# Patient Record
Sex: Female | Born: 1980 | Hispanic: No | Marital: Married | Smoking: Never smoker
Health system: Southern US, Community
[De-identification: ages and names within clinical notes are randomized; demographics above are authoritative.]

## PROBLEM LIST (undated history)

## (undated) DIAGNOSIS — R112 Nausea with vomiting, unspecified: Secondary | ICD-10-CM

## (undated) DIAGNOSIS — Z9889 Other specified postprocedural states: Secondary | ICD-10-CM

## (undated) DIAGNOSIS — M79605 Pain in left leg: Secondary | ICD-10-CM

## (undated) DIAGNOSIS — T4145XA Adverse effect of unspecified anesthetic, initial encounter: Secondary | ICD-10-CM

## (undated) DIAGNOSIS — T8859XA Other complications of anesthesia, initial encounter: Secondary | ICD-10-CM

## (undated) DIAGNOSIS — M79604 Pain in right leg: Secondary | ICD-10-CM

## (undated) HISTORY — PX: TUBAL LIGATION: SHX77

---

## 2007-06-06 ENCOUNTER — Inpatient Hospital Stay (HOSPITAL_COMMUNITY): Admission: RE | Admit: 2007-06-06 | Discharge: 2007-06-09 | Payer: Self-pay | Admitting: Obstetrics and Gynecology

## 2007-06-06 ENCOUNTER — Encounter (INDEPENDENT_AMBULATORY_CARE_PROVIDER_SITE_OTHER): Payer: Self-pay | Admitting: Obstetrics and Gynecology

## 2010-09-20 NOTE — Op Note (Signed)
[name], [name]                 ACCOUNT NO.:  [id number]   MEDICAL RECORD NO.:  [id number]          PATIENT TYPE:  INP   LOCATION:  9126                          FACILITY:  [facility]   PHYSICIAN:  Maxie Better, M.D.DATE OF BIRTH:  12-08-1980   DATE OF PROCEDURE:  06/06/2007  DATE OF DISCHARGE:                               OPERATIVE REPORT   PREOPERATIVE DIAGNOSES:  1. Term gestation.  2. Previous difficult delivery, with neonatal death.   PROCEDURE:  Primary cesarean section, Kerr hysterotomy, left ovarian  cystectomy.   POSTOPERATIVE DIAGNOSES:  1. Term gestation.  2. Previous difficult delivery, with neonatal death.  3. Left ovarian cyst.   ANESTHESIA:  Spinal.   SURGEON:  Maxie Better, M.D.   ASSISTANT:  None.   INDICATIONS:  A 30 year old gravida 2, para 1-0-0-0 female at term,  admitted for a primary cesarean section secondary to previous difficult  delivery in [place], with resultant neonatal death at 60 days of age.  Surgical risk was reviewed with the patient.  Consent was signed.  The  patient was transferred to the operating room.   PROCEDURE:  Under adequate spinal anesthesia, the patient was placed in  the supine position with a left lateral tilt.  She was sterilely prepped  and draped in the usual fashion.  An indwelling Foley catheter was  sterilely placed.  One-quarter percent Marcaine was injected along the  planned Pfannenstiel skin incision site.  Pfannenstiel skin incision was  then made, carried down to the rectus fascia.  Rectus fascia was then  opened with cautery and extended transversely.  The rectus fascia was  then bluntly and sharply dissected off the rectus muscle in superior and  inferior fashion.  The rectus muscles was split in the midline.  The  parietal peritoneum was entered sharply and extended.  The vesicouterine  peritoneum was opened transversely.  The bladder was bluntly dissected  off the lower uterine segment and displaced  inferiorly using a bladder  retractor.  Large vessels were noted on both lateral aspects of the  uterus.  A curvilinear low transverse uterine incision was performed and  extended with bandage scissors, taking care to try to avoid those large  vessels.  Artificial rupture of membranes occurred.  Clear amniotic  fluid noted.  Subsequent attempted delivery from the vertex position was  unsuccessful.  Therefore, a vacuum was applied.  Suction was gently  placed, and subsequent delivery of a live female infant was accomplished.  Baby was bulb suctioned on the abdomen.  His cord was clamped, cut, and  he was transferred to the awaiting pediatrician, who assigned Apgars of  8 and 9 at one and five minutes.  The placenta, which was anterior, was  removed manually.  Uterine cavity was cleaned of debris.  Uterine  incision did not appear to have any extension.  However, there were  pumping vessels noted at both angles which were then clamped.  The  cavity was cleaned of debris.  Uterine incision was then closed in two  layers, the first layer with 0 Monocryl running- locked stitch.  However,  continued bleeding was noted on the right angle, and it was  then noted that the inferior aspect of probably the uterine vessel had  been bleeding.  Careful 0 Monocryl suturing individually was placed,  with still bleeding noted, and a layering stitch was then placed with 0  Monocryl stitch after exteriorizing the uterus.  The second layer was  then imbricated with 0 Monocryl.  Good hemostasis noted.  Normal right  tube and ovary were noted.  Normal left tube was noted.  The left ovary  had about a 3 cm cyst which appeared to be clear visually.  The decision  was then made to remove that cyst, and using a #15 mm blade, the cyst  wall was opened.  The fluid was noted subsequently clear, unruptured,  and the cyst wall was then removed.  The base was cauterized of small  bleeders, and the defect in the ovary was  then closed with 3-0 Vicryl  sutures.  The dead space was also closed with mattress stitches of 0  Vicryl sutures.  The abdomen was then copiously irrigated, suctioned,  and small bleeders cauterized.  The uterus was then returned to the  abdomen carefully. Re-irrigation of the abdomen was then performed.  Reinspection of the incision showed good hemostasis.  The parietal  peritoneum was closed with 2-0 Vicryl.  The rectus fascia was closed  with 0 Vicryl x2.  The subcutaneous area was irrigated, and small  bleeders cauterized.  Interrupted 2-0 plain sutures were then placed,  and staples were then placed along the incision.   SPECIMENS:  1. Placenta, not sent.  2. Left ovarian cyst wall sent to pathology.   ESTIMATED BLOOD LOSS:  1300 mL.   INTRAOPERATIVE FLUID:  3500 mL crystalloid.   URINE OUTPUT:  400 mL clear yellow urine.   SPONGE AND INSTRUMENT COUNTS X2:  Correct.   COMPLICATIONS:  None.   The patient tolerated this procedure well.  Weight of the baby was 7  pounds 6 ounces.  She was transferred to the recovery room in stable  condition.      Maxie Better, M.D.  Electronically Signed     /MEDQ  D:  06/06/2007  T:  06/07/2007  Job:  [id number]

## 2010-09-23 NOTE — Discharge Summary (Signed)
[name], [name]                 ACCOUNT NO.:  [id number]   MEDICAL RECORD NO.:  [id number]          PATIENT TYPE:  INP   LOCATION:  9126                          FACILITY:  [facility]   PHYSICIAN:  Maxie Better, M.D.DATE OF BIRTH:  1980-07-16   DATE OF ADMISSION:  06/06/2007  DATE OF DISCHARGE:  06/09/2007                               DISCHARGE SUMMARY   ADMISSION DIAGNOSIS:  Previously difficult delivery,  Term gestation.   DISCHARGE DIAGNOSIS:  Term gestation delivered  Status post  primary cesarean section  Left ovarian cyst.   PROCEDURES:  Primary cesarean section, Kerr hysterotomy, left ovarian  cystectomy.   HISTORY OF PRESENT ILLNESS:  This 30 year old gravida 2, para 1-0-0-1,  married black female at term, admitted for primary cesarean section  secondary to previous difficult delivery which resulted in neonatal  death at 74 days of age and the recommendation of cesarean section for  future deliveries by the previous obstetrician.  The patient had  uncomplicated prenatal course.   HOSPITAL COURSE:  The patient was admitted to [facility].  She  underwent a primary cesarean section.  Finding at the time of surgery  was a 3 cm left ovarian cyst which was not noted to be present  previously, and therefore, left ovarian cystectomy was performed.  The  procedure with respect to the cesarean section resulted in delivery of 7  pounds 6 ounces live female, Apgars of 8 and 9.  The placenta was not  sent, normal tubes were noted bilaterally and normal right ovary.  The  left ovary with the cyst as noted.  The patient had an uncomplicated  postoperative course.  Her CBC on postop day #1 showed a hemoglobin 8.2,  hematocrit 24.5, platelets 152,000, white count of 15.5.  intraoperative  blood loss had been 400 mL.  Her preop hemoglobin was 11.8.  The patient  was asymptomatic.  By postop day #3, the incision showed no evidence of  an infection.  The patient was started on iron  supplementation.  Her  incision was intact.  She was ready for discharge.   DISPOSITION:  Home.   CONDITION:  Stable.   DISCHARGE MEDICATIONS:  1. Tylox 1-2 tablets q.3-4 hours p.r.n. pain.  2. Iron supplementation one p.o. b.i.d.  3. Prenatal vitamins one p.o. daily.   DISCHARGE INSTRUCTIONS:  Per the postpartum booklet given.  Follow-up  appointment at [facility] in 6 weeks.      Maxie Better, M.D.  Electronically Signed     [place]/MEDQ  D:  06/23/2007  T:  06/25/2007  Job:  [id number]

## 2011-01-27 LAB — CBC
HCT: 35.1 — ABNORMAL LOW
Hemoglobin: 8.2 — ABNORMAL LOW
MCHC: 33.6
Platelets: 178
RBC: 2.8 — ABNORMAL LOW
RDW: 15.7 — ABNORMAL HIGH
RDW: 16.1 — ABNORMAL HIGH
WBC: 6.9

## 2011-01-27 LAB — RPR: RPR Ser Ql: NONREACTIVE

## 2017-11-13 ENCOUNTER — Ambulatory Visit (INDEPENDENT_AMBULATORY_CARE_PROVIDER_SITE_OTHER): Payer: Worker's Compensation | Admitting: Orthopaedic Surgery

## 2017-11-13 ENCOUNTER — Ambulatory Visit (INDEPENDENT_AMBULATORY_CARE_PROVIDER_SITE_OTHER): Payer: Worker's Compensation

## 2017-11-13 ENCOUNTER — Encounter (INDEPENDENT_AMBULATORY_CARE_PROVIDER_SITE_OTHER): Payer: Self-pay | Admitting: Orthopaedic Surgery

## 2017-11-13 VITALS — BP 117/76 | HR 75 | Ht 65.0 in | Wt 155.0 lb

## 2017-11-13 DIAGNOSIS — M545 Low back pain: Secondary | ICD-10-CM

## 2017-11-13 DIAGNOSIS — G8929 Other chronic pain: Secondary | ICD-10-CM

## 2017-11-13 NOTE — Progress Notes (Signed)
Office Visit Note/surgical second opinion   Patient: [name]           Date of Birth: 10/22/1980           MRN: [id number] Visit Date: 11/13/2017              Requested by: No referring provider defined for this encounter. PCP: Patient, No Pcp Per   Assessment & Plan: Visit Diagnoses:  1. 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  I discussed with her that with the retrolisthesis she is unlikely to do well with simple microdiscectomy whether it is done unilaterally or bilaterally and agree with surgical plan for single level instrumented fusion.  Scans were reviewed as well as plain x-rays.  Questions were elicited and answered with patient also rehab nurse Henrene PastorKathy Davis, RN, [place] [facility]-S.  Pathophysiology of condition discussed.  Follow-Up Instructions: No follow-ups on file.   Orders:  Orders Placed This Encounter  Procedures  . XR Lumb Spine Flex&Ext Only   No orders of the defined types were placed in this encounter.     Procedures: No procedures performed   Clinical Data: No additional findings.   Subjective: Chief Complaint  Patient presents with  . Lower Back - Pain    HPI 37 year old female here for surgical second opinion concerning a back injury that occurred on 11/24/2016.  She is here with Henrene PastorKathy Davis, RN medical case manager her fax #[phone].  [facility] and cervical [facility] had physical therapy conservative treatment.  MRI scan showed large H&P at L5-S1 with disc abutting S1 nerve root bilaterally with some retrolisthesis.  She was treated with TENS unit, therapy, work conditioning, lumbar epidurals by Dr. Maurice SmallIbazebo x2 without sustained relief.  She has seen Dr. Yevette Edwardsumonski who recommended single level fusion with instrumentation due to her large disc herniation with central and  lateral recess stenosis and retrolisthesis.  She has mild facet changes at L4-5 without compression.  Other lumbar levels above are normal.  Patient was originally injured helping lift the patient out of bed.  Patient suddenly started falling and fell on top of her.  She is had persistent problems with back aching pain that radiates into her right and also left buttocks and into her legs.  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  She is able to ambulate without walking aid.  She has increased pain with bending twisting lifting.  Inflammatories, prednisone, injections, Neurontin.  Patient lives with her husband children and sister-in-law.  Patient was on modified duty for period of time, had increased symptoms currently not working.  She last worked.  [employer] 89 and [age].  C-section x2 otherwise she is been healthy.  Patient denies bowel or bladder symptoms.  She occasionally has some discomfort in her shoulders and neck.  Review of Systems positive for previous C-section x2.  No cardiac respiratory problems.  GI GU negative.   Objective: Vital Signs: BP 117/76   Pulse 75   Ht 5\' 5"  (1.651 m)   Wt 155 lb (70.3 kg)   LMP  (LMP Unknown)   BMI 25.79 kg/m   Physical Exam  Constitutional: She is oriented to person, place, and time. She appears well-developed.  HENT:  Head: Normocephalic.  Right Ear: External ear normal.  Left Ear: External ear normal.  Eyes: Pupils  are equal, round, and reactive to light.  Neck: No tracheal deviation present. No thyromegaly present.  Cardiovascular: Normal rate.  Pulmonary/Chest: Effort normal.  Abdominal: Soft.  Neurological: She is alert and oriented to person, place, and time.  Skin: Skin is warm and dry.  Psychiatric: She has a normal mood and affect. Her behavior is normal.    Ortho Exam patient is able ambulate in the exam room she can heel and toe walk no isolated motor weakness gastrocsoleus or anterior tib peroneals posterior tib are normal.   She has sciatic notch tenderness both right and left pain with straight leg raising at 90 degrees.  Patient has slight slow gait.  Knee and ankle jerk are 2+ and symmetrical no quad or abductor weakness.  Hamstrings are normal.  Distal pulses are normal.  Specialty Comments:  No specialty comments available.  Imaging:ateral flexion-extension lumbar x-rays were obtained and reviewed.  This shows narrowing at L5-S1 disc space with a few millimeters retrolisthesis unchanged on motion films.  Impression: L5-S1 narrowing with retrolisthesis stable on flexion extension.     PMFS History: There are no active problems to display for this patient.  History reviewed. No pertinent past medical history.  No family history on file.  History reviewed. No pertinent surgical history. Social History   Occupational History  . Not on file  Tobacco Use  . Smoking status: Never Smoker  . Smokeless tobacco: Never Used  Substance and Sexual Activity  . Alcohol use: Not Currently  . Drug use: Not on file  . Sexual activity: Not on file

## 2017-11-14 ENCOUNTER — Encounter (INDEPENDENT_AMBULATORY_CARE_PROVIDER_SITE_OTHER): Payer: Self-pay | Admitting: Orthopaedic Surgery

## 2017-11-15 ENCOUNTER — Telehealth (INDEPENDENT_AMBULATORY_CARE_PROVIDER_SITE_OTHER): Payer: Self-pay

## 2017-11-15 NOTE — Telephone Encounter (Signed)
Emailed office note to case mgr Henrene PastorKathy Davis [facility]([email or web address])

## 2017-11-15 NOTE — Telephone Encounter (Signed)
-----   Message from Eldred MangesMark C Yates, MD sent at 11/14/2017 12:10 PM EDT ----- Cc to W/C and rehab nurse Henrene PastorKathy Davis fax in dictation thanks

## 2017-12-11 ENCOUNTER — Other Ambulatory Visit: Payer: Self-pay | Admitting: Orthopedic Surgery

## 2017-12-19 NOTE — Pre-Procedure Instructions (Signed)
[name]  12/19/2017      [facility] - [place] ([place]), [place] - [address] [place] [id number] Phone: [phone] Fax: [phone]    Your procedure is scheduled on Aug. 22, 2019 from 1:18PM-6:02PM  [facility] "A" at 11:15AM  Call this number if you have problems the morning of surgery:  [phone]   Remember:  Do not eat or drink after midnight on Aug. 21st    Take these medicines the morning of surgery with A SIP OF WATER: Gabapentin (NEURONTIN)  As of today, stop taking all Other Aspirin Products, Vitamins, Fish oils, and Herbal medications. Also stop all NSAIDS i.e. Advil, Ibuprofen, Motrin, Aleve, Anaprox, Naproxen, BC, Goody Powders, and all Supplements.    Do not wear jewelry, make-up or nail polish.  Do not wear lotions, powders, or perfumes, or deodorant.  Do not shave 48 hours prior to surgery.    Do not bring valuables to the hospital.  [facility] is not responsible for any belongings or valuables.  Contacts, dentures or bridgework may not be worn into surgery.  Leave your suitcase in the car.  After surgery it may be brought to your room.  For patients admitted to the hospital, discharge time will be determined by your treatment team.  Patients discharged the day of surgery will not be allowed to drive home.   Special instructions:   - Preparing For Surgery  Before surgery, you can play an important role. Because skin is not sterile, your skin needs to be as free of germs as possible. You can reduce the number of germs on your skin by washing with CHG (chlorahexidine gluconate) Soap before surgery.  CHG is an antiseptic cleaner which kills germs and bonds with the skin to continue killing germs even after washing.    Oral Hygiene is also important to reduce your risk of infection.  Remember - BRUSH YOUR TEETH THE MORNING OF SURGERY WITH YOUR REGULAR  TOOTHPASTE  Please do not use if you have an allergy to CHG or antibacterial soaps. If your skin becomes reddened/irritated stop using the CHG.  Do not shave (including legs and underarms) for at least 48 hours prior to first CHG shower. It is OK to shave your face.  Please follow these instructions carefully.   1. Shower the NIGHT BEFORE SURGERY and the MORNING OF SURGERY with CHG.   2. If you chose to wash your hair, wash your hair first as usual with your normal shampoo.  3. After you shampoo, rinse your hair and body thoroughly to remove the shampoo.  4. Use CHG as you would any other liquid soap. You can apply CHG directly to the skin and wash gently with a scrungie or a clean washcloth.   5. Apply the CHG Soap to your body ONLY FROM THE NECK DOWN.  Do not use on open wounds or open sores. Avoid contact with your eyes, ears, mouth and genitals (private parts). Wash Face and genitals (private parts)  with your normal soap.  6. Wash thoroughly, paying special attention to the area where your surgery will be performed.  7. Thoroughly rinse your body with warm water from the neck down.  8. DO NOT shower/wash with your normal soap after using and rinsing off the CHG Soap.  9. Pat yourself dry with a CLEAN TOWEL.  10. Wear CLEAN PAJAMAS to bed the night before surgery, wear  comfortable clothes the morning of surgery  11. Place CLEAN SHEETS on your bed the night of your first shower and DO NOT SLEEP WITH PETS.  Day of Surgery:  Do not apply any deodorants/lotions.  Please wear clean clothes to the hospital/surgery center.   Remember to brush your teeth WITH YOUR REGULAR TOOTHPASTE.  Please read over the following fact sheets that you were given. Pain Booklet, Coughing and Deep Breathing, MRSA Information and Surgical Site Infection Prevention

## 2017-12-20 ENCOUNTER — Encounter (HOSPITAL_COMMUNITY)
Admission: RE | Admit: 2017-12-20 | Discharge: 2017-12-20 | Disposition: A | Discharge status: Home / Self Care | Payer: Worker's Compensation | Source: Ambulatory Visit | Attending: Orthopedic Surgery | Admitting: Orthopedic Surgery

## 2017-12-20 ENCOUNTER — Ambulatory Visit (HOSPITAL_COMMUNITY)
Admission: RE | Admit: 2017-12-20 | Discharge: 2017-12-20 | Disposition: A | Discharge status: Home / Self Care | Payer: Worker's Compensation | Source: Ambulatory Visit | Attending: Orthopedic Surgery | Admitting: Orthopedic Surgery

## 2017-12-20 ENCOUNTER — Other Ambulatory Visit: Payer: Self-pay

## 2017-12-20 ENCOUNTER — Encounter (HOSPITAL_COMMUNITY): Payer: Self-pay

## 2017-12-20 DIAGNOSIS — R9431 Abnormal electrocardiogram [ECG] [EKG]: Secondary | ICD-10-CM | POA: Diagnosis not present

## 2017-12-20 DIAGNOSIS — Z01818 Encounter for other preprocedural examination: Secondary | ICD-10-CM

## 2017-12-20 DIAGNOSIS — Z01812 Encounter for preprocedural laboratory examination: Secondary | ICD-10-CM | POA: Insufficient documentation

## 2017-12-20 DIAGNOSIS — Z01810 Encounter for preprocedural cardiovascular examination: Secondary | ICD-10-CM | POA: Diagnosis present

## 2017-12-20 HISTORY — DX: Adverse effect of unspecified anesthetic, initial encounter: T41.45XA

## 2017-12-20 HISTORY — DX: Pain in right leg: M79.604

## 2017-12-20 HISTORY — DX: Pain in right leg: M79.605

## 2017-12-20 HISTORY — DX: Other complications of anesthesia, initial encounter: T88.59XA

## 2017-12-20 HISTORY — DX: Other specified postprocedural states: Z98.890

## 2017-12-20 HISTORY — DX: Other specified postprocedural states: R11.2

## 2017-12-20 LAB — CBC WITH DIFFERENTIAL/PLATELET
ABS IMMATURE GRANULOCYTES: 0 10*3/uL (ref 0.0–0.1)
Basophils Absolute: 0 10*3/uL (ref 0.0–0.1)
Basophils Relative: 1 %
EOS PCT: 4 %
Eosinophils Absolute: 0.2 10*3/uL (ref 0.0–0.7)
HEMATOCRIT: 41.3 % (ref 36.0–46.0)
HEMOGLOBIN: 12.4 g/dL (ref 12.0–15.0)
IMMATURE GRANULOCYTES: 0 %
LYMPHS PCT: 42 %
Lymphs Abs: 2.3 10*3/uL (ref 0.7–4.0)
MCH: 27.1 pg (ref 26.0–34.0)
MCHC: 30 g/dL (ref 30.0–36.0)
MCV: 90.4 fL (ref 78.0–100.0)
Monocytes Absolute: 0.5 10*3/uL (ref 0.1–1.0)
Monocytes Relative: 9 %
Neutro Abs: 2.5 10*3/uL (ref 1.7–7.7)
Neutrophils Relative %: 44 %
Platelets: 249 10*3/uL (ref 150–400)
RBC: 4.57 MIL/uL (ref 3.87–5.11)
RDW: 13 % (ref 11.5–15.5)
WBC: 5.4 10*3/uL (ref 4.0–10.5)

## 2017-12-20 LAB — APTT: aPTT: 30 seconds (ref 24–36)

## 2017-12-20 LAB — COMPREHENSIVE METABOLIC PANEL
ALBUMIN: 3.9 g/dL (ref 3.5–5.0)
ALK PHOS: 66 U/L (ref 38–126)
ALT: 13 U/L (ref 0–44)
AST: 18 U/L (ref 15–41)
Anion gap: 6 (ref 5–15)
BILIRUBIN TOTAL: 0.5 mg/dL (ref 0.3–1.2)
BUN: 7 mg/dL (ref 6–20)
CALCIUM: 9.4 mg/dL (ref 8.9–10.3)
CO2: 25 mmol/L (ref 22–32)
Chloride: 106 mmol/L (ref 98–111)
Creatinine, Ser: 0.78 mg/dL (ref 0.44–1.00)
GFR calc Af Amer: >60 mL/min (ref >60)
GFR calc non Af Amer: >60 mL/min (ref >60)
GLUCOSE: 77 mg/dL (ref 70–99)
Potassium: 4 mmol/L (ref 3.5–5.1)
Sodium: 137 mmol/L (ref 135–145)
TOTAL PROTEIN: 7 g/dL (ref 6.5–8.1)

## 2017-12-20 LAB — TYPE AND SCREEN
ABO/RH(D): A POS
ANTIBODY SCREEN: NEGATIVE

## 2017-12-20 LAB — URINALYSIS, ROUTINE W REFLEX MICROSCOPIC
BILIRUBIN URINE: NEGATIVE
Glucose, UA: NEGATIVE mg/dL
HGB URINE DIPSTICK: NEGATIVE
Ketones, ur: NEGATIVE mg/dL
Leukocytes, UA: NEGATIVE
Nitrite: NEGATIVE
Protein, ur: NEGATIVE mg/dL
SPECIFIC GRAVITY, URINE: 1.003 — AB (ref 1.005–1.030)
pH: 8 (ref 5.0–8.0)

## 2017-12-20 LAB — PROTIME-INR
INR: 1.08
Prothrombin Time: 13.9 seconds (ref 11.4–15.2)

## 2017-12-20 LAB — SURGICAL PCR SCREEN
MRSA, PCR: NEGATIVE
STAPHYLOCOCCUS AUREUS: POSITIVE — AB

## 2017-12-20 LAB — ABO/RH: ABO/RH(D): A POS

## 2017-12-20 NOTE — Progress Notes (Signed)
Left a message for the pt regarding the nasal pcr screening positive for Staph. [facility].

## 2017-12-20 NOTE — Progress Notes (Signed)
PCP - Denies  Cardiologist - Denies  Chest x-ray - 12/20/17  EKG - 12/20/17  Stress Test - Denies  ECHO - Denies  Cardiac Cath - Denies  AICD- Denies PM- Denies LOOP- Denies  Sleep Study - Denies CPAP - None  LABS- 12/20/17: CBC w/D, CMP, PT, PTT, T/S, UA, PCR 12/27/17: POC Upreg  ASA- Denies   Anesthesia- No  Pt denies having chest pain, sob, or fever at this time. All instructions explained to the pt, with a verbal understanding of the material. Pt agrees to go over the instructions while at home for a better understanding. The opportunity to ask questions was provided.

## 2017-12-25 NOTE — Progress Notes (Signed)
Patient's [employer]man [employer] advisor called in regards to patients symptoms after using Mupirocin.  Per the workman's comp advisor the patient is having increased sinus drainage and her throat is hurting.  I advised the workman's comp advisor that the patient will need to call and explain her symptoms to a RN and that we can give further instructions based on the patients symptoms.

## 2017-12-27 ENCOUNTER — Inpatient Hospital Stay (HOSPITAL_COMMUNITY): Payer: Worker's Compensation

## 2017-12-27 ENCOUNTER — Encounter (HOSPITAL_COMMUNITY): Payer: Self-pay | Admitting: *Deleted

## 2017-12-27 ENCOUNTER — Inpatient Hospital Stay (HOSPITAL_COMMUNITY)
Admission: RE | Admit: 2017-12-27 | Discharge: 2017-12-28 | DRG: 455 | Disposition: A | Discharge status: Home / Self Care | Payer: Worker's Compensation | Source: Ambulatory Visit | Attending: Orthopedic Surgery | Admitting: Orthopedic Surgery

## 2017-12-27 ENCOUNTER — Inpatient Hospital Stay (HOSPITAL_COMMUNITY): Payer: Worker's Compensation | Admitting: Physician Assistant

## 2017-12-27 ENCOUNTER — Inpatient Hospital Stay (HOSPITAL_COMMUNITY)
Admission: RE | Disposition: A | Discharge status: Home / Self Care | Payer: Self-pay | Source: Ambulatory Visit | Attending: Orthopedic Surgery

## 2017-12-27 ENCOUNTER — Other Ambulatory Visit: Payer: Self-pay

## 2017-12-27 DIAGNOSIS — M5410 Radiculopathy, site unspecified: Secondary | ICD-10-CM | POA: Diagnosis present

## 2017-12-27 DIAGNOSIS — M4316 Spondylolisthesis, lumbar region: Secondary | ICD-10-CM | POA: Diagnosis present

## 2017-12-27 DIAGNOSIS — M545 Low back pain: Secondary | ICD-10-CM | POA: Diagnosis present

## 2017-12-27 DIAGNOSIS — M5416 Radiculopathy, lumbar region: Secondary | ICD-10-CM | POA: Diagnosis present

## 2017-12-27 DIAGNOSIS — Z79899 Other long term (current) drug therapy: Secondary | ICD-10-CM

## 2017-12-27 DIAGNOSIS — Z419 Encounter for procedure for purposes other than remedying health state, unspecified: Secondary | ICD-10-CM

## 2017-12-27 HISTORY — PX: TRANSFORAMINAL LUMBAR INTERBODY FUSION (TLIF) WITH PEDICLE SCREW FIXATION 1 LEVEL: SHX6141

## 2017-12-27 LAB — POCT PREGNANCY, URINE: Preg Test, Ur: NEGATIVE

## 2017-12-27 SURGERY — TRANSFORAMINAL LUMBAR INTERBODY FUSION (TLIF) WITH PEDICLE SCREW FIXATION 1 LEVEL
Anesthesia: General

## 2017-12-27 MED ORDER — PHENOL 1.4 % MT LIQD: 1.0000 | OROMUCOSAL | Status: DC | PRN

## 2017-12-27 MED ORDER — PROMETHAZINE HCL 25 MG/ML IJ SOLN: 6.2500 mg | INTRAMUSCULAR | Status: DC | PRN

## 2017-12-27 MED ORDER — ALUM & MAG HYDROXIDE-SIMETH 200-200-20 MG/5ML PO SUSP: 30.0000 mL | Freq: Four times a day (QID) | ORAL | Status: DC | PRN

## 2017-12-27 MED ORDER — ONDANSETRON HCL 4 MG/2ML IJ SOLN
INTRAMUSCULAR | Status: DC | PRN
  Administered 2017-12-27: 4 mg via INTRAVENOUS

## 2017-12-27 MED ORDER — HYDROMORPHONE HCL 1 MG/ML IJ SOLN
INTRAMUSCULAR | Status: AC
  Filled 2017-12-27: qty 1

## 2017-12-27 MED ORDER — POTASSIUM CHLORIDE IN NACL 20-0.9 MEQ/L-% IV SOLN: INTRAVENOUS | Status: DC

## 2017-12-27 MED ORDER — BUPIVACAINE-EPINEPHRINE (PF) 0.25% -1:200000 IJ SOLN
INTRAMUSCULAR | Status: AC
  Filled 2017-12-27: qty 30

## 2017-12-27 MED ORDER — CEFAZOLIN SODIUM-DEXTROSE 2-4 GM/100ML-% IV SOLN
2.0000 g | INTRAVENOUS | Status: AC
  Administered 2017-12-27: 2 g via INTRAVENOUS
  Filled 2017-12-27: qty 100

## 2017-12-27 MED ORDER — OXYCODONE HCL 5 MG/5ML PO SOLN: 5.0000 mg | Freq: Once | ORAL | Status: DC | PRN

## 2017-12-27 MED ORDER — THROMBIN 20000 UNITS EX SOLR
CUTANEOUS | Status: DC | PRN
  Administered 2017-12-27: 20000 [IU] via TOPICAL

## 2017-12-27 MED ORDER — FENTANYL CITRATE (PF) 250 MCG/5ML IJ SOLN
INTRAMUSCULAR | Status: DC | PRN
  Administered 2017-12-27: 100 ug via INTRAVENOUS
  Administered 2017-12-27: 50 ug via INTRAVENOUS
  Administered 2017-12-27: 100 ug via INTRAVENOUS

## 2017-12-27 MED ORDER — CEFAZOLIN SODIUM-DEXTROSE 2-4 GM/100ML-% IV SOLN
2.0000 g | Freq: Three times a day (TID) | INTRAVENOUS | Status: AC
  Administered 2017-12-27 – 2017-12-28 (×2): 2 g via INTRAVENOUS
  Filled 2017-12-27 (×2): qty 100

## 2017-12-27 MED ORDER — SODIUM CHLORIDE 0.9 % IV SOLN: 250.0000 mL | INTRAVENOUS | Status: DC

## 2017-12-27 MED ORDER — METHYLENE BLUE 0.5 % INJ SOLN
INTRAVENOUS | Status: DC | PRN
  Administered 2017-12-27: 1 mL

## 2017-12-27 MED ORDER — FLEET ENEMA 7-19 GM/118ML RE ENEM: 1.0000 | ENEMA | Freq: Once | RECTAL | Status: DC | PRN

## 2017-12-27 MED ORDER — POVIDONE-IODINE 7.5 % EX SOLN
Freq: Once | CUTANEOUS | Status: DC
  Filled 2017-12-27: qty 118

## 2017-12-27 MED ORDER — LACTATED RINGERS IV SOLN
INTRAVENOUS | Status: DC | PRN
  Administered 2017-12-27: 15:00:00 via INTRAVENOUS

## 2017-12-27 MED ORDER — PROPOFOL 10 MG/ML IV BOLUS
INTRAVENOUS | Status: AC
  Filled 2017-12-27: qty 20

## 2017-12-27 MED ORDER — MIDAZOLAM HCL 2 MG/2ML IJ SOLN
INTRAMUSCULAR | Status: AC
  Filled 2017-12-27: qty 2

## 2017-12-27 MED ORDER — SUGAMMADEX SODIUM 200 MG/2ML IV SOLN
INTRAVENOUS | Status: DC | PRN
  Administered 2017-12-27: 150 mg via INTRAVENOUS

## 2017-12-27 MED ORDER — ROCURONIUM BROMIDE 50 MG/5ML IV SOSY
PREFILLED_SYRINGE | INTRAVENOUS | Status: AC
  Filled 2017-12-27: qty 5

## 2017-12-27 MED ORDER — FENTANYL CITRATE (PF) 250 MCG/5ML IJ SOLN
INTRAMUSCULAR | Status: AC
  Filled 2017-12-27: qty 5

## 2017-12-27 MED ORDER — LABETALOL HCL 5 MG/ML IV SOLN
INTRAVENOUS | Status: AC
  Filled 2017-12-27: qty 4

## 2017-12-27 MED ORDER — ONDANSETRON HCL 4 MG/2ML IJ SOLN
4.0000 mg | Freq: Four times a day (QID) | INTRAMUSCULAR | Status: DC | PRN
  Administered 2017-12-27: 4 mg via INTRAVENOUS

## 2017-12-27 MED ORDER — SODIUM CHLORIDE 0.9% FLUSH: 3.0000 mL | INTRAVENOUS | Status: DC | PRN

## 2017-12-27 MED ORDER — DOCUSATE SODIUM 100 MG PO CAPS
100.0000 mg | ORAL_CAPSULE | Freq: Two times a day (BID) | ORAL | Status: DC
  Administered 2017-12-27 – 2017-12-28 (×2): 100 mg via ORAL
  Filled 2017-12-27 (×2): qty 1

## 2017-12-27 MED ORDER — MENTHOL 3 MG MT LOZG: 1.0000 | LOZENGE | OROMUCOSAL | Status: DC | PRN

## 2017-12-27 MED ORDER — SODIUM CHLORIDE 0.9% FLUSH: 3.0000 mL | Freq: Two times a day (BID) | INTRAVENOUS | Status: DC

## 2017-12-27 MED ORDER — BUPIVACAINE LIPOSOME 1.3 % IJ SUSP
INTRAMUSCULAR | Status: DC | PRN
  Administered 2017-12-27: 20 mL

## 2017-12-27 MED ORDER — DIAZEPAM 5 MG PO TABS
5.0000 mg | ORAL_TABLET | Freq: Four times a day (QID) | ORAL | Status: DC | PRN
  Administered 2017-12-28 (×2): 5 mg via ORAL
  Filled 2017-12-27 (×2): qty 1

## 2017-12-27 MED ORDER — METHYLENE BLUE 0.5 % INJ SOLN
INTRAVENOUS | Status: AC
  Filled 2017-12-27: qty 10

## 2017-12-27 MED ORDER — ROCURONIUM BROMIDE 100 MG/10ML IV SOLN
INTRAVENOUS | Status: DC | PRN
  Administered 2017-12-27: 50 mg via INTRAVENOUS
  Administered 2017-12-27: 30 mg via INTRAVENOUS

## 2017-12-27 MED ORDER — SENNOSIDES-DOCUSATE SODIUM 8.6-50 MG PO TABS: 1.0000 | ORAL_TABLET | Freq: Every evening | ORAL | Status: DC | PRN

## 2017-12-27 MED ORDER — MEPERIDINE HCL 50 MG/ML IJ SOLN: 6.2500 mg | INTRAMUSCULAR | Status: DC | PRN

## 2017-12-27 MED ORDER — SCOPOLAMINE 1 MG/3DAYS TD PT72
MEDICATED_PATCH | TRANSDERMAL | Status: DC | PRN
  Administered 2017-12-27: 1 via TRANSDERMAL

## 2017-12-27 MED ORDER — LIDOCAINE HCL (CARDIAC) PF 100 MG/5ML IV SOSY
PREFILLED_SYRINGE | INTRAVENOUS | Status: DC | PRN
  Administered 2017-12-27: 70 mg via INTRAVENOUS

## 2017-12-27 MED ORDER — MIDAZOLAM HCL 2 MG/2ML IJ SOLN
INTRAMUSCULAR | Status: DC | PRN
  Administered 2017-12-27: 2 mg via INTRAVENOUS

## 2017-12-27 MED ORDER — PROPOFOL 10 MG/ML IV BOLUS
INTRAVENOUS | Status: DC | PRN
  Administered 2017-12-27: 150 mg via INTRAVENOUS

## 2017-12-27 MED ORDER — ONDANSETRON HCL 4 MG/2ML IJ SOLN
INTRAMUSCULAR | Status: AC
  Administered 2017-12-27: 4 mg via INTRAVENOUS
  Filled 2017-12-27: qty 2

## 2017-12-27 MED ORDER — BISACODYL 5 MG PO TBEC: 5.0000 mg | DELAYED_RELEASE_TABLET | Freq: Every day | ORAL | Status: DC | PRN

## 2017-12-27 MED ORDER — BUPIVACAINE-EPINEPHRINE 0.25% -1:200000 IJ SOLN
INTRAMUSCULAR | Status: DC | PRN
  Administered 2017-12-27: 7 mL

## 2017-12-27 MED ORDER — 0.9 % SODIUM CHLORIDE (POUR BTL) OPTIME
TOPICAL | Status: DC | PRN
  Administered 2017-12-27 (×3): 1000 mL

## 2017-12-27 MED ORDER — THROMBIN (RECOMBINANT) 20000 UNITS EX SOLR
CUTANEOUS | Status: AC
  Filled 2017-12-27: qty 20000

## 2017-12-27 MED ORDER — ACETAMINOPHEN 325 MG PO TABS: 650.0000 mg | ORAL_TABLET | ORAL | Status: DC | PRN

## 2017-12-27 MED ORDER — DEXAMETHASONE SODIUM PHOSPHATE 10 MG/ML IJ SOLN
INTRAMUSCULAR | Status: DC | PRN
  Administered 2017-12-27: 10 mg via INTRAVENOUS

## 2017-12-27 MED ORDER — OXYCODONE-ACETAMINOPHEN 5-325 MG PO TABS
1.0000 | ORAL_TABLET | ORAL | Status: DC | PRN
  Administered 2017-12-27 – 2017-12-28 (×4): 2 via ORAL
  Filled 2017-12-27 (×4): qty 2

## 2017-12-27 MED ORDER — PANTOPRAZOLE SODIUM 40 MG PO TBEC
40.0000 mg | DELAYED_RELEASE_TABLET | Freq: Two times a day (BID) | ORAL | Status: DC
  Administered 2017-12-28: 40 mg via ORAL
  Filled 2017-12-27: qty 1

## 2017-12-27 MED ORDER — ZOLPIDEM TARTRATE 5 MG PO TABS: 5.0000 mg | ORAL_TABLET | Freq: Every evening | ORAL | Status: DC | PRN

## 2017-12-27 MED ORDER — BUPIVACAINE LIPOSOME 1.3 % IJ SUSP
10.0000 mL | INTRAMUSCULAR | Status: DC
  Filled 2017-12-27: qty 10

## 2017-12-27 MED ORDER — BUPIVACAINE LIPOSOME 1.3 % IJ SUSP
20.0000 mL | INTRAMUSCULAR | Status: AC
  Filled 2017-12-27: qty 20

## 2017-12-27 MED ORDER — PROPOFOL 500 MG/50ML IV EMUL
INTRAVENOUS | Status: DC | PRN
  Administered 2017-12-27: 75 ug/kg/min via INTRAVENOUS

## 2017-12-27 MED ORDER — HYDROMORPHONE HCL 1 MG/ML IJ SOLN
0.2500 mg | INTRAMUSCULAR | Status: DC | PRN
  Administered 2017-12-27: 0.5 mg via INTRAVENOUS
  Administered 2017-12-27: 0.25 mg via INTRAVENOUS
  Administered 2017-12-27: 0.5 mg via INTRAVENOUS

## 2017-12-27 MED ORDER — OXYCODONE HCL 5 MG PO TABS: 5.0000 mg | ORAL_TABLET | Freq: Once | ORAL | Status: DC | PRN

## 2017-12-27 MED ORDER — ACETAMINOPHEN 650 MG RE SUPP: 650.0000 mg | RECTAL | Status: DC | PRN

## 2017-12-27 MED ORDER — LIDOCAINE 2% (20 MG/ML) 5 ML SYRINGE
INTRAMUSCULAR | Status: AC
  Filled 2017-12-27: qty 5

## 2017-12-27 MED ORDER — LACTATED RINGERS IV SOLN
INTRAVENOUS | Status: DC
  Administered 2017-12-27: 10 mL/h via INTRAVENOUS

## 2017-12-27 MED ORDER — SODIUM CHLORIDE 0.9 % IV SOLN
INTRAVENOUS | Status: DC | PRN
  Administered 2017-12-27: 15 ug/min via INTRAVENOUS

## 2017-12-27 MED ORDER — ONDANSETRON HCL 4 MG PO TABS: 4.0000 mg | ORAL_TABLET | Freq: Four times a day (QID) | ORAL | Status: DC | PRN

## 2017-12-27 MED ORDER — MORPHINE SULFATE (PF) 2 MG/ML IV SOLN: 1.0000 mg | INTRAVENOUS | Status: DC | PRN

## 2017-12-27 MED ORDER — GABAPENTIN 300 MG PO CAPS
300.0000 mg | ORAL_CAPSULE | Freq: Three times a day (TID) | ORAL | Status: DC
  Administered 2017-12-27 – 2017-12-28 (×2): 300 mg via ORAL
  Filled 2017-12-27 (×2): qty 1

## 2017-12-27 SURGICAL SUPPLY — 87 items
BENZOIN TINCTURE PRP APPL 2/3 (GAUZE/BANDAGES/DRESSINGS) ×3 IMPLANT
BLADE CLIPPER SURG (BLADE) IMPLANT
BONE VIVIGEN FORMABLE 10CC (Bone Implant) ×3 IMPLANT
BUR PRESCISION 1.7 ELITE (BURR) ×3 IMPLANT
BUR ROUND FLUTED 5 RND (BURR) ×2 IMPLANT
BUR ROUND FLUTED 5MM RND (BURR) ×1
BUR ROUND PRECISION 4.0 (BURR) IMPLANT
BUR ROUND PRECISION 4.0MM (BURR)
BUR SABER RD CUTTING 3.0 (BURR) IMPLANT
BUR SABER RD CUTTING 3.0MM (BURR)
CAGE CONCORDE BULLET 9X9X23 (Cage) ×2 IMPLANT
CAGE SPNL 5D BLT 23X9X9X (Cage) ×1 IMPLANT
CARTRIDGE OIL MAESTRO DRILL (MISCELLANEOUS) ×1 IMPLANT
CLOSURE STERI-STRIP 1/2X4 (GAUZE/BANDAGES/DRESSINGS) ×1
CLOSURE WOUND 1/2 X4 (GAUZE/BANDAGES/DRESSINGS) ×2
CLSR STERI-STRIP ANTIMIC 1/2X4 (GAUZE/BANDAGES/DRESSINGS) ×2 IMPLANT
CONT SPEC 4OZ CLIKSEAL STRL BL (MISCELLANEOUS) ×3 IMPLANT
COVER MAYO STAND STRL (DRAPES) ×6 IMPLANT
COVER SURGICAL LIGHT HANDLE (MISCELLANEOUS) ×3 IMPLANT
DIFFUSER DRILL AIR PNEUMATIC (MISCELLANEOUS) ×3 IMPLANT
DRAIN CHANNEL 15F RND FF W/TCR (WOUND CARE) IMPLANT
DRAPE C-ARM 42X72 X-RAY (DRAPES) ×3 IMPLANT
DRAPE C-ARMOR (DRAPES) IMPLANT
DRAPE POUCH INSTRU U-SHP 10X18 (DRAPES) ×3 IMPLANT
DRAPE SURG 17X23 STRL (DRAPES) ×12 IMPLANT
DURAPREP 26ML APPLICATOR (WOUND CARE) ×3 IMPLANT
ELECT BLADE 4.0 EZ CLEAN MEGAD (MISCELLANEOUS) ×3
ELECT CAUTERY BLADE 6.4 (BLADE) ×3 IMPLANT
ELECT REM PT RETURN 9FT ADLT (ELECTROSURGICAL) ×3
ELECTRODE BLDE 4.0 EZ CLN MEGD (MISCELLANEOUS) ×1 IMPLANT
ELECTRODE REM PT RTRN 9FT ADLT (ELECTROSURGICAL) ×1 IMPLANT
EVACUATOR SILICONE 100CC (DRAIN) IMPLANT
FEE INTRAOP MONITOR IMPULS NCS (MISCELLANEOUS) ×1 IMPLANT
FILTER STRAW FLUID ASPIR (MISCELLANEOUS) ×3 IMPLANT
GAUZE 4X4 16PLY RFD (DISPOSABLE) ×3 IMPLANT
GAUZE SPONGE 4X4 12PLY STRL (GAUZE/BANDAGES/DRESSINGS) ×3 IMPLANT
GLOVE BIO SURGEON STRL SZ7 (GLOVE) ×3 IMPLANT
GLOVE BIO SURGEON STRL SZ8 (GLOVE) ×3 IMPLANT
GLOVE BIOGEL PI IND STRL 7.0 (GLOVE) ×1 IMPLANT
GLOVE BIOGEL PI IND STRL 8 (GLOVE) ×1 IMPLANT
GLOVE BIOGEL PI INDICATOR 7.0 (GLOVE) ×2
GLOVE BIOGEL PI INDICATOR 8 (GLOVE) ×2
GOWN STRL REUS W/ TWL LRG LVL3 (GOWN DISPOSABLE) ×2 IMPLANT
GOWN STRL REUS W/ TWL XL LVL3 (GOWN DISPOSABLE) ×1 IMPLANT
GOWN STRL REUS W/TWL LRG LVL3 (GOWN DISPOSABLE) ×4
GOWN STRL REUS W/TWL XL LVL3 (GOWN DISPOSABLE) ×2
INTRAOP MONITOR FEE IMPULS NCS (MISCELLANEOUS) ×1
INTRAOP MONITOR FEE IMPULSE (MISCELLANEOUS) ×2
IV CATH 14GX2 1/4 (CATHETERS) ×3 IMPLANT
KIT BASIN OR (CUSTOM PROCEDURE TRAY) ×3 IMPLANT
KIT POSITION SURG JACKSON T1 (MISCELLANEOUS) ×3 IMPLANT
KIT TURNOVER KIT B (KITS) ×3 IMPLANT
MARKER SKIN DUAL TIP RULER LAB (MISCELLANEOUS) ×6 IMPLANT
NDL SAFETY ECLIPSE 18X1.5 (NEEDLE) ×1 IMPLANT
NEEDLE 22X1 1/2 (OR ONLY) (NEEDLE) ×6 IMPLANT
NEEDLE HYPO 18GX1.5 SHARP (NEEDLE) ×2
NEEDLE HYPO 25GX1X1/2 BEV (NEEDLE) ×3 IMPLANT
NEEDLE SPNL 18GX3.5 QUINCKE PK (NEEDLE) ×6 IMPLANT
NS IRRIG 1000ML POUR BTL (IV SOLUTION) ×9 IMPLANT
OIL CARTRIDGE MAESTRO DRILL (MISCELLANEOUS) ×3
PACK LAMINECTOMY ORTHO (CUSTOM PROCEDURE TRAY) ×3 IMPLANT
PACK UNIVERSAL I (CUSTOM PROCEDURE TRAY) ×3 IMPLANT
PAD ARMBOARD 7.5X6 YLW CONV (MISCELLANEOUS) ×6 IMPLANT
PATTIES SURGICAL .5 X1 (DISPOSABLE) ×3 IMPLANT
PATTIES SURGICAL .5X1.5 (GAUZE/BANDAGES/DRESSINGS) ×3 IMPLANT
ROD PRE BENT EXPEDIUM 35MM (Rod) ×6 IMPLANT
SCREW CORTICAL VIPER 7X40MM (Screw) ×6 IMPLANT
SCREW SET SINGLE INNER (Screw) ×12 IMPLANT
SCREW VIPER CORTICAL FIX 6X40 (Screw) ×6 IMPLANT
SPONGE INTESTINAL PEANUT (DISPOSABLE) ×3 IMPLANT
SPONGE SURGIFOAM ABS GEL 100 (HEMOSTASIS) ×3 IMPLANT
STRIP CLOSURE SKIN 1/2X4 (GAUZE/BANDAGES/DRESSINGS) ×4 IMPLANT
SURGIFLO W/THROMBIN 8M KIT (HEMOSTASIS) IMPLANT
SUT MNCRL AB 4-0 PS2 18 (SUTURE) ×3 IMPLANT
SUT VIC AB 0 CT1 18XCR BRD 8 (SUTURE) ×1 IMPLANT
SUT VIC AB 0 CT1 8-18 (SUTURE) ×2
SUT VIC AB 1 CT1 18XCR BRD 8 (SUTURE) ×1 IMPLANT
SUT VIC AB 1 CT1 8-18 (SUTURE) ×2
SUT VIC AB 2-0 CT2 18 VCP726D (SUTURE) ×3 IMPLANT
SYR 20CC LL (SYRINGE) ×6 IMPLANT
SYR BULB IRRIGATION 50ML (SYRINGE) ×3 IMPLANT
SYR CONTROL 10ML LL (SYRINGE) ×6 IMPLANT
SYR TB 1ML LUER SLIP (SYRINGE) ×3 IMPLANT
TAPE CLOTH SURG 4X10 WHT LF (GAUZE/BANDAGES/DRESSINGS) ×3 IMPLANT
TRAY FOLEY MTR SLVR 16FR STAT (SET/KITS/TRAYS/PACK) ×3 IMPLANT
WATER STERILE IRR 1000ML POUR (IV SOLUTION) ×3 IMPLANT
YANKAUER SUCT BULB TIP NO VENT (SUCTIONS) ×3 IMPLANT

## 2017-12-27 NOTE — Anesthesia Preprocedure Evaluation (Signed)
Anesthesia Evaluation  Patient identified by MRN, date of birth, ID band Patient awake    Reviewed: Allergy & Precautions, NPO status , Patient's Chart, lab work & pertinent test results  History of Anesthesia Complications (+) PONV  Airway Mallampati: II  TM Distance: >3 FB Neck ROM: Full    Dental no notable dental hx.    Pulmonary neg pulmonary ROS,    Pulmonary exam normal breath sounds clear to auscultation       Cardiovascular negative cardio ROS Normal cardiovascular exam Rhythm:Regular Rate:Normal     Neuro/Psych negative neurological ROS  negative psych ROS   GI/Hepatic negative GI ROS, Neg liver ROS,   Endo/Other  negative endocrine ROS  Renal/GU negative Renal ROS  negative genitourinary   Musculoskeletal negative musculoskeletal ROS (+)   Abdominal   Peds negative pediatric ROS (+)  Hematology negative hematology ROS (+)   Anesthesia Other Findings Degenerative disk disease  Reproductive/Obstetrics negative OB ROS                             Anesthesia Physical Anesthesia Plan  ASA: II  Anesthesia Plan: General   Post-op Pain Management:    Induction: Intravenous  PONV Risk Score and Plan: 4 or greater and Ondansetron, Dexamethasone, Midazolam, Scopolamine patch - Pre-op and Treatment may vary due to age or medical condition  Airway Management Planned: Oral ETT  Additional Equipment:   Intra-op Plan:   Post-operative Plan: Extubation in OR  Informed Consent: I have reviewed the patients History and Physical, chart, labs and discussed the procedure including the risks, benefits and alternatives for the proposed anesthesia with the patient or authorized representative who has indicated his/her understanding and acceptance.   Dental advisory given  Plan Discussed with: CRNA  Anesthesia Plan Comments:         Anesthesia Quick Evaluation

## 2017-12-27 NOTE — Op Note (Signed)
MEDICAL RECORD NO.:   [id number]   PHYSICIAN:  Estill Bamberg, MD      DATE OF BIRTH:  [birth year]   DATE OF PROCEDURE:  12/27/2017                               OPERATIVE REPORT   PREOPERATIVE DIAGNOSES: 1. Bilateral lumbar radiculopathy  2. Severe low back pain 3. Severe L5-S1 degenerative disc disease   POSTOPERATIVE DIAGNOSES: 1. Bilateral lumbar radiculopathy  2. Severe low back pain 3. Severe L5-S1 degenerative disc disease   PROCEDURES: 1. Right-sided L5-S1 transforaminal lumbar interbody fusion. 2. Left-sided L5-S1 posterolateral fusion. 3. Insertion of interbody device x1 (9 x 23 mm lordotic Concorde     intervertebral spacer). 4. Placement of posterior instrumentation at L5, S1 bilaterally. 5. Use of local autograft. 6. Use of morselized allograft - ViviGen. 7. Intraoperative use of fluoroscopy.   SURGEON:  Estill Bamberg, MD.   ASSISTANTJason Coop, PA-C.   ANESTHESIA:  General endotracheal anesthesia.   COMPLICATIONS:  None.   DISPOSITION:  Stable.   ESTIMATED BLOOD LOSS:   Minimal   INDICATIONS FOR SURGERY:  Briefly, Ms. Nolton is a pleasant 37 year old female who did present to me with severe and ongoing pain in the low back and bilateral legs.  Her MRI was notable for the findings outlined above. We did have an extensive discussion about surgery, and she did elect to proceed with the procedure noted above.      OPERATIVE DETAILS:  On 12/27/2017, the patient was brought to surgery and general endotracheal anesthesia was administered.  The patient was placed prone on a well-padded flat Jackson bed with a spinal frame.  Antibiotics were given and a time-out procedure was performed. The back was prepped and draped in the usual fashion.  A midline incision was made overlying the L5-S1 intervertebral space.  The fascia was incised at the midline.  The paraspinal musculature was bluntly swept laterally.  Anatomic landmarks for the pedicles were  exposed. Using fluoroscopy, I did cannulate the L5 and S1 pedicles bilaterally, using a medial to lateral cortical trajectory technique.  On the left side, the posterolateral gutter and left facet joint at L5/S1 was decorticated and 6 x 40 mm screws were placed at L5 and 7 x 40 mm screws were placed at S1 and a 35-mm rod was placed and distraction was applied across the rod on the left side.  On the right side, the cannulated pedicle holes were filled with bone wax.  I then proceeded with the decompressive aspect of the procedure.  On the right side, I did perform a thorough and complete neuroforaminal decompression, with near-complete removal of the right L5-S1 facet joint. I was able to expose the exiting right L5 nerve, and the traversing right S1 nerve as well.   With an assistant holding medial retraction of the traversing right S1 nerve, I did perform an annulotomy at the posterolateral aspect of the L5/S1 intervertebral space.  I then used a series of curettes and pituitary rongeurs to perform a thorough and complete intervertebral diskectomy.  The intervertebral space was then liberally packed with autograft as well as allograft in the form of ViviGen, as was the appropriate-sized intervertebral spacer.  The spacer was then tamped into position in the usual fashion.  I was very pleased with the press-fit of the spacer.  I then placed 6 x 40 mm screws on the right at  L5 and 7 x 40 mm screws at S1.  A 35-mm rod was then placed and caps were placed. The distraction was then released on the contralateral left side.  All 4 caps were then locked.  The wound was copiously irrigated with a total of approximately 3 L prior to placing the bone graft.  Additional autograft and allograft were then packed into the posterolateral gutter on the left side to help aid in the L5-S1 fusion.  The wound was explored for any undue bleeding and there was no substantial bleeding encountered.  Gel-Foam was  placed over the laminectomy site.  The wound was then closed in layers using #1 Vicryl followed by 2-0 Vicryl, followed by 4-0 Monocryl.  Benzoin and Steri-Strips were applied followed by sterile dressing.   There was no abnormal EMG activity noted throughout the entire surgery.   Of note, Jason CoopKayla McKenzie was my assistant throughout surgery, and did aid in retraction, suctioning, and closure.         Estill BambergMark Adis Sturgill, MD

## 2017-12-27 NOTE — Transfer of Care (Signed)
Immediate Anesthesia Transfer of Care Note  Patient: [name]  Procedure(s) Performed: RIGHT LUMBAR 5 - SACRUM 1 TRANSFORAMINAL LUMBAR INTERBODY FUSION WITH INSTRUMENTATION AND ALLOGRAFT (N/A )  Patient Location: PACU  Anesthesia Type:General  Level of Consciousness: drowsy and patient cooperative  Airway & Oxygen Therapy: Patient Spontanous Breathing and Patient connected to nasal cannula oxygen  Post-op Assessment: Report given to RN and Post -op Vital signs reviewed and stable  Post vital signs: Reviewed and stable  Last Vitals:  Vitals Value Taken Time  BP 122/69 12/27/2017  6:21 PM  Temp    Pulse 88 12/27/2017  6:25 PM  Resp 14 12/27/2017  6:25 PM  SpO2 100 % 12/27/2017  6:25 PM  Vitals shown include unvalidated device data.  Last Pain:  Vitals:   12/27/17 1057  TempSrc:   PainSc: 6          Complications: No apparent anesthesia complications

## 2017-12-27 NOTE — Anesthesia Procedure Notes (Signed)
Procedure Name: Intubation Date/Time: 12/27/2017 2:21 PM Performed by: Raenette Rover, CRNA Pre-anesthesia Checklist: Patient identified, Emergency Drugs available, Suction available and Patient being monitored Patient Re-evaluated:Patient Re-evaluated prior to induction Oxygen Delivery Method: Circle system utilized Preoxygenation: Pre-oxygenation with 100% oxygen Induction Type: IV induction Ventilation: Mask ventilation without difficulty Laryngoscope Size: Mac and 3 Grade View: Grade III Tube type: Oral Tube size: 7.0 mm Number of attempts: 1 Airway Equipment and Method: Stylet Placement Confirmation: positive ETCO2,  CO2 detector and breath sounds checked- equal and bilateral Secured at: 21 cm Tube secured with: Tape Dental Injury: Teeth and Oropharynx as per pre-operative assessment

## 2017-12-27 NOTE — Anesthesia Postprocedure Evaluation (Signed)
Anesthesia Post Note  Patient: [name]  Procedure(s) Performed: RIGHT LUMBAR 5 - SACRUM 1 TRANSFORAMINAL LUMBAR INTERBODY FUSION WITH INSTRUMENTATION AND ALLOGRAFT (N/A )     Patient location during evaluation: PACU Anesthesia Type: General Level of consciousness: awake and alert Pain management: pain level controlled Vital Signs Assessment: post-procedure vital signs reviewed and stable Respiratory status: spontaneous breathing, nonlabored ventilation, respiratory function stable and patient connected to nasal cannula oxygen Cardiovascular status: blood pressure returned to baseline and stable Postop Assessment: no apparent nausea or vomiting Anesthetic complications: no    Last Vitals:  Vitals:   12/27/17 2000 12/27/17 2015  BP: 115/68 122/83  Pulse: 71 85  Resp: 10 14  Temp:  (!) 36.4 C  SpO2: 100% 100%    Last Pain:  Vitals:   12/27/17 2015  TempSrc:   PainSc: 6                  Daniel Johndrow COKER

## 2017-12-27 NOTE — H&P (Signed)
PREOPERATIVE H&P  Chief Complaint: Low back pain, bilateral leg pain  HPI: [name] is a 37 y.o. female who presents with ongoing pain in the back and bilateral legs  MRI reveals severe L5/S1 DDD  Patient has failed multiple forms of extensive conservative care and continues to have pain (see office notes for additional details regarding the patient's full course of treatment)  Past Medical History:  Diagnosis Date  . Complication of anesthesia    Hard to wake up  . Leg pain, bilateral    Right Greater than Left  . PONV (postoperative nausea and vomiting)    Past Surgical History:  Procedure Laterality Date  . CESAREAN SECTION     x2  . TUBAL LIGATION     Social History   Socioeconomic History  . Marital status: Married    Spouse name: Not on file  . Number of children: Not on file  . Years of education: Not on file  . Highest education level: Not on file  Occupational History  . Not on file  Social Needs  . Financial resource strain: Not on file  . Food insecurity:    Worry: Not on file    Inability: Not on file  . Transportation needs:    Medical: Not on file    Non-medical: Not on file  Tobacco Use  . Smoking status: Never Smoker  . Smokeless tobacco: Never Used  Substance and Sexual Activity  . Alcohol use: Not Currently  . Drug use: Not on file  . Sexual activity: Not on file  Lifestyle  . Physical activity:    Days per week: Not on file    Minutes per session: Not on file  . Stress: Not on file  Relationships  . Social connections:    Talks on phone: Not on file    Gets together: Not on file    Attends religious service: Not on file    Active member of club or organization: Not on file    Attends meetings of clubs or organizations: Not on file    Relationship status: Not on file  Other Topics Concern  . Not on file  Social History Narrative  . Not on file   No family history on file. No Known Allergies Prior to Admission  medications   Medication Sig Start Date End Date Taking? Authorizing Provider  diclofenac sodium (VOLTAREN) 1 % GEL Apply 1 application topically 3 (three) times daily as needed (pain).    Yes [provider]  gabapentin (NEURONTIN) 300 MG capsule Take 300 mg by mouth 3 (three) times daily.    Yes [provider]  naproxen sodium (ALEVE) 220 MG tablet Take 220 mg by mouth daily as needed (pain).   Yes [provider]     All other systems have been reviewed and were otherwise negative with the exception of those mentioned in the HPI and as above.  Physical Exam: There were no vitals filed for this visit.  There is no height or weight on file to calculate BMI.  General: Alert, no acute distress Cardiovascular: No pedal edema Respiratory: No cyanosis, no use of accessory musculature Skin: No lesions in the area of chief complaint Neurologic: Sensation intact distally Psychiatric: Patient is competent for consent with normal mood and affect Lymphatic: No axillary or cervical lymphadenopathy   Assessment/Plan: BILATERAL LUMBAR RADICULOPATHY, LOW BACK PAIN, L5/S1 DDD Plan for Procedure(s): RIGHT LUMBAR 5 - SACRUM 1 TRANSFORAMINAL LUMBAR INTERBODY FUSION WITH  INSTRUMENTATION AND ALLOGRAFT   Emilee Hero, MD 12/27/2017 8:00 AM

## 2017-12-28 ENCOUNTER — Encounter (HOSPITAL_COMMUNITY): Payer: Self-pay | Admitting: Orthopedic Surgery

## 2017-12-28 MED ORDER — HYDROXYZINE HCL 50 MG/ML IM SOLN
50.0000 mg | Freq: Four times a day (QID) | INTRAMUSCULAR | Status: DC | PRN
  Administered 2017-12-28: 50 mg via INTRAMUSCULAR
  Filled 2017-12-28: qty 1

## 2017-12-28 MED FILL — Thrombin (Recombinant) For Soln 20000 Unit: CUTANEOUS | Qty: 1 | Status: AC

## 2017-12-28 NOTE — Progress Notes (Signed)
Patient is discharged from room 3C07 at this time. Alert and in stable condition. IV site d/c'd and instructions read to patient and spouse with understanding verbalized. Left unit via wheelchair with all belongings at side. 

## 2017-12-28 NOTE — Evaluation (Signed)
Occupational Therapy Evaluation Patient Details Name: [name] MRN: [id number] DOB: 08/12/1980 Today's Date: 12/28/2017    History of Present Illness [name] is a 37 y.o. female s/p TLIF at L5-S1. No significant PMH.   Clinical Impression   Pt is s/p TLIF L5-S1. Pt is now presenting with post op pain and reduced ROM d/t spinal precautions that limits her ability to perform LB ADLs. Pt demo good adherence to spinal precautions and safety awareness. Pt will have 24 hr (S) from family and assistance as needed for LB dressing, no AD needed at this time. Edu provided. No further OT needs indicated at this time. OT will sign off.     Follow Up Recommendations  No OT follow up    Equipment Recommendations  None recommended by OT    Recommendations for Other Services       Precautions / Restrictions Precautions Precautions: Back Precaution Booklet Issued: Yes (comment) Precaution Comments: Reviewed in full with pt. Required Braces or Orthoses: Spinal Brace Spinal Brace: Thoracolumbosacral orthotic;Applied in sitting position Restrictions Weight Bearing Restrictions: No      Mobility Bed Mobility Overal bed mobility: Needs Assistance Bed Mobility: Rolling;Sidelying to Sit Rolling: Modified independent (Device/Increase time) Sidelying to sit: Supervision       General bed mobility comments: VCs for log roll sequence; supervision for safety; increased time and effort noted  Transfers Overall transfer level: Needs assistance Equipment used: None Transfers: Sit to/from Stand Sit to Stand: Supervision         General transfer comment: Pt slow with sit to stand but no lifting A required and good adherence to back precautions    Balance Overall balance assessment: Mild deficits observed, not formally tested                                         ADL either performed or assessed with clinical judgement   ADL Overall ADL's : Needs  assistance/impaired Eating/Feeding: Independent   Grooming: Wash/dry hands;Modified independent;Standing   Upper Body Bathing: Supervision/ safety;Sitting   Lower Body Bathing: Minimal assistance;Sit to/from stand   Upper Body Dressing : Modified independent;Sitting   Lower Body Dressing: Minimal assistance;Sit to/from stand Lower Body Dressing Details (indicate cue type and reason): Pt's husband will assist with dressing Toilet Transfer: Supervision/safety   Toileting- Clothing Manipulation and Hygiene: Supervision/safety   Tub/ Shower Transfer: Walk-in shower;Supervision/safety   Functional mobility during ADLs: Supervision/safety       Vision Baseline Vision/History: No visual deficits Patient Visual Report: No change from baseline Vision Assessment?: No apparent visual deficits     Perception     Praxis      Pertinent Vitals/Pain Pain Assessment: 0-10 Pain Score: 3  Pain Location: back Pain Descriptors / Indicators: Discomfort;Operative site guarding;Sore Pain Intervention(s): Monitored during session     Hand Dominance Right   Extremity/Trunk Assessment Upper Extremity Assessment Upper Extremity Assessment: Overall WFL for tasks assessed   Lower Extremity Assessment Lower Extremity Assessment: Defer to PT evaluation   Cervical / Trunk Assessment Cervical / Trunk Assessment: Other exceptions Cervical / Trunk Exceptions: s/p spinal surgery   Communication Communication Communication: No difficulties   Cognition Arousal/Alertness: Awake/alert Behavior During Therapy: WFL for tasks assessed/performed Overall Cognitive Status: Within Functional Limits for tasks assessed  General Comments       Exercises     Shoulder Instructions      Home Living Family/patient expects to be discharged to:: Private residence Living Arrangements: Spouse/significant other Available Help at Discharge:  Family;Available 24 hours/day Type of Home: House Home Access: Level entry     Home Layout: One level     Bathroom Shower/Tub: Producer, television/film/video: Standard Bathroom Accessibility: Yes How Accessible: Accessible via walker Home Equipment: None   Additional Comments: Pt will have 24 hour assist from husband until mother arrives. At which time husband will resume a regular work scheduele and mother will be available to assist 24 hours for 2-3 months.       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 List: Decreased range of motion;Impaired balance (sitting and/or standing);Decreased knowledge of use of DME or AE;Pain      OT Treatment/Interventions:      OT Goals(Current goals can be found in the care plan section) Acute Rehab OT Goals Patient Stated Goal: to go home OT Goal Formulation: All assessment and education complete, DC therapy  OT Frequency:     Barriers to D/C:            Co-evaluation              AM-PAC PT "6 Clicks" Daily Activity     Outcome Measure Help from another person eating meals?: None Help from another person taking care of personal grooming?: None Help from another person toileting, which includes using toliet, bedpan, or urinal?: A Little Help from another person bathing (including washing, rinsing, drying)?: A Little Help from another person to put on and taking off regular upper body clothing?: None Help from another person to put on and taking off regular lower body clothing?: A Little 6 Click Score: 21   End of Session Nurse Communication: Mobility status  Activity Tolerance: Patient tolerated treatment well Patient left: in bed;with call bell/phone within reach  OT Visit Diagnosis: Unsteadiness on feet (R26.81)                Time: 1610-9604 OT Time Calculation (min): 12 min Charges:  OT General Charges $OT Visit: 1  Visit OT Evaluation $OT Eval Low Complexity: 1 Low   Crissie Reese OTR/L 12/28/2017, 11:14 AM

## 2017-12-28 NOTE — Progress Notes (Signed)
Physical Therapy Evaluation and Discharge Patient Details Name: [name] Nee MRN: [id number] DOB: 07/11/1980 Today's Date: 12/28/2017   History of Present Illness  [name] Salih is a 37 y.o. female s/p TLIF at L5-S1. No significant PMH.  Clinical Impression  Patient evaluated by PT with no further acute PT needs identified. All education has been completed and the pt has no further questions. Pt initially gross min g to mod I for transfers due to reports of blurred vision upon standing, which quickly ceased. Pt progressed to mod I for ambulation without the need for an AD. Pt educated on precautions, positioning, and generalized walking program. See below for any follow up physical therapy or equipment needs. PT is signing off. Thank you for this referral.     Follow Up Recommendations No PT follow up;Supervision for mobility/OOB    Equipment Recommendations  None recommended by PT    Recommendations for Other Services       Precautions / Restrictions Precautions Precautions: Back Precaution Booklet Issued: Yes (comment) Precaution Comments: Reviewed in full with pt. Required Braces or Orthoses: Spinal Brace Spinal Brace: Thoracolumbosacral orthotic;Applied in sitting position Restrictions Weight Bearing Restrictions: No      Mobility  Bed Mobility Overal bed mobility: Needs Assistance Bed Mobility: Rolling;Sidelying to Sit Rolling: Modified independent (Device/Increase time) Sidelying to sit: Supervision       General bed mobility comments: VCs for log roll sequence; supervision for safety; increased time and effort noted  Transfers Overall transfer level: Needs assistance Equipment used: None Transfers: Sit to/from Stand Sit to Stand: Min guard;Supervision;Modified independent (Device/Increase time)         General transfer comment: pt slow and guarded initially; min g for steadying upon standing; pt demosntrated safe hand placement when ascending and descending;  progressed to Mod I with descent to hard surface demonstrating safe hand placement   Ambulation/Gait Ambulation/Gait assistance: Min guard;Supervision;Modified independent (Device/Increase time) Gait Distance (Feet): 500 Feet Assistive device: None Gait Pattern/deviations: Step-through pattern Gait velocity: decreased Gait velocity interpretation: <1.31 ft/sec, indicative of household ambulator General Gait Details: pt slow and guarded initially, improving gait speed throughout session; Min g initially reporting eyes were blurry but denied being dizzy or lightheaded; progressed to supervision and then to mod I as blurred vision improved; pt able to read signs in hallway without any issues  Stairs            Wheelchair Mobility    Modified Rankin (Stroke Patients Only)       Balance Overall balance assessment: Mild deficits observed, not formally tested                                           Pertinent Vitals/Pain Pain Assessment: 0-10 Pain Score: 5  Pain Location: back Pain Descriptors / Indicators: Discomfort;Operative site guarding;Sore Pain Intervention(s): Limited activity within patient's tolerance;Monitored during session;Repositioned    Home Living Family/patient expects to be discharged to:: Private residence Living Arrangements: Spouse/significant other Available Help at Discharge: Family;Available 24 hours/day Type of Home: House Home Access: Level entry     Home Layout: One level Home Equipment: None Additional Comments: Pt will have 24 hour assist from husband until mother arrives. At which time husband will resume a regular work scheduele and mother will be available to assist 24 hours for 2-3 months.     Prior Function Level of Independence: Independent  Hand Dominance   Dominant Hand: Right    Extremity/Trunk Assessment   Upper Extremity Assessment Upper Extremity Assessment: Overall WFL for tasks  assessed    Lower Extremity Assessment Lower Extremity Assessment: Overall WFL for tasks assessed    Cervical / Trunk Assessment Cervical / Trunk Assessment: Other exceptions Cervical / Trunk Exceptions: s/p spinal surgery  Communication   Communication: No difficulties  Cognition Arousal/Alertness: Awake/alert Behavior During Therapy: WFL for tasks assessed/performed Overall Cognitive Status: Within Functional Limits for tasks assessed                                        General Comments      Exercises     Assessment/Plan    PT Assessment Patent does not need any further PT services  PT Problem List Decreased strength;Decreased range of motion;Decreased activity tolerance;Decreased mobility;Pain       PT Treatment Interventions Gait training;Functional mobility training;Therapeutic activities;Patient/family education    PT Goals (Current goals can be found in the Care Plan section)  Acute Rehab PT Goals Patient Stated Goal: to go home PT Goal Formulation: With patient Time For Goal Achievement: 01/04/18 Potential to Achieve Goals: Good    Frequency Min 5X/week   Barriers to discharge        Co-evaluation               AM-PAC PT "6 Clicks" Daily Activity  Outcome Measure Difficulty turning over in bed (including adjusting bedclothes, sheets and blankets)?: A Little Difficulty moving from lying on back to sitting on the side of the bed? : A Little Difficulty sitting down on and standing up from a chair with arms (e.g., wheelchair, bedside commode, etc,.)?: A Little Help needed moving to and from a bed to chair (including a wheelchair)?: None Help needed walking in hospital room?: None Help needed climbing 3-5 steps with a railing? : A Little 6 Click Score: 20    End of Session Equipment Utilized During Treatment: Gait belt;Back brace Activity Tolerance: Patient tolerated treatment well Patient left: in chair;with call bell/phone  within reach Nurse Communication: Mobility status PT Visit Diagnosis: Other abnormalities of gait and mobility (R26.89);Pain Pain - part of body: (back)    Time: 1610-9604 PT Time Calculation (min) (ACUTE ONLY): 22 min   Charges:     PT Treatments $Gait Training: 8-22 mins        Donzetta Kohut, [place]  Student Physical Therapist Acute Rehab [phone]   Donzetta Kohut 12/28/2017, 10:32 AM

## 2017-12-28 NOTE — Progress Notes (Signed)
    Patient doing well  Minimal expected LBP Patient denies leg pain Has been ambulating    Physical Exam: Vitals:   12/27/17 2349 12/28/17 0406  BP: 110/76 106/64  Pulse: 77 69  Resp: 18 20  Temp: 98.5 F (36.9 C) 98.5 F (36.9 C)  SpO2: 100% 100%   Patient appears very comfortable Dressing in place NVI  POD #1 s/p L5/S1 fusion, doing well  - up with PT/OT, encourage ambulation - Percocet for pain, Valium for muscle spasms - d/c home today with f/u in 2 weeks

## 2017-12-31 MED FILL — Heparin Sodium (Porcine) Inj 1000 Unit/ML: INTRAMUSCULAR | Qty: 30 | Status: AC

## 2017-12-31 MED FILL — Sodium Chloride IV Soln 0.9%: INTRAVENOUS | Qty: 1000 | Status: AC

## 2018-01-02 NOTE — Discharge Summary (Signed)
Patient ID: [name] MRN: [id number] DOB/AGE: 38/06/1980 39 y.o.  Admit date: 12/27/2017 Discharge date: 12/28/2017  Admission Diagnoses:  Active Problems:   Radiculopathy   Discharge Diagnoses:  Same  Past Medical History:  Diagnosis Date  . Complication of anesthesia    Hard to wake up  . Leg pain, bilateral    Right Greater than Left  . PONV (postoperative nausea and vomiting)     Surgeries: Procedure(s): RIGHT LUMBAR 5 - SACRUM 1 TRANSFORAMINAL LUMBAR INTERBODY FUSION WITH INSTRUMENTATION AND ALLOGRAFT on 12/27/2017   Consultants: None  Discharged Condition: Improved  Hospital Course: [name] is an 37 y.o. female who was admitted 12/27/2017 for operative treatment of radiculopathy. Patient has severe unremitting pain that affects sleep, daily activities, and work/hobbies. After pre-op clearance the patient was taken to the operating room on 12/27/2017 and underwent  Procedure(s): RIGHT LUMBAR 5 - SACRUM 1 TRANSFORAMINAL LUMBAR INTERBODY FUSION WITH INSTRUMENTATION AND ALLOGRAFT.    Patient was given perioperative antibiotics:  Anti-infectives (From admission, onward)   Start     Dose/Rate Route Frequency Ordered Stop   12/27/17 2200  ceFAZolin (ANCEF) IVPB 2g/100 mL premix     2 g 200 mL/hr over 30 Minutes Intravenous Every 8 hours 12/27/17 2046 12/28/17 0537   12/27/17 1015  ceFAZolin (ANCEF) IVPB 2g/100 mL premix     2 g 200 mL/hr over 30 Minutes Intravenous On call to O.R. 12/27/17 1014 12/27/17 1433       Patient was given sequential compression devices, early ambulation to prevent DVT.  Patient benefited maximally from hospital stay and there were no complications.    Recent vital signs: BP 109/65 (BP Location: Right Arm)   Pulse 64   Temp 98.6 F (37 C) (Oral)   Resp 18   Ht 5\' 5"  (1.651 m)   Wt 73 kg   SpO2 99%   BMI 26.78 kg/m    Discharge Medications:   Allergies as of 12/28/2017   No Known Allergies     Medication List    TAKE  these medications   gabapentin 300 MG capsule Commonly known as:  NEURONTIN Take 300 mg by mouth 3 (three) times daily.       Diagnostic Studies: Dg Chest 2 View  Result Date: 12/20/2017 CLINICAL DATA:  37 y/o  F; preop exam for lower back surgery. EXAM: CHEST - 2 VIEW COMPARISON:  None. FINDINGS: The heart size and mediastinal contours are within normal limits. Both lungs are clear. The visualized skeletal structures are unremarkable. IMPRESSION: No active cardiopulmonary disease. Electronically Signed   By: Mitzi HansenLance  Furusawa-Stratton M.D.   On: 12/20/2017 13:53   Dg Lumbar Spine 2-3 Views  Result Date: 12/27/2017 CLINICAL DATA:  Intraoperative fluoroscopy. L5-S1 right transforaminal lumbar interbody fusion with instrumentation and allograft., EXAM: DG C-ARM 61-120 MIN; LUMBAR SPINE - 2-3 VIEW COMPARISON:  11/13/2017 FINDINGS: Exam demonstrates placement of posterior fusion hardware with bilateral pedicle screws at the L5-S1 level as hardware is intact. Interbody fusion at L5-S1. Remainder of the exam unchanged. IMPRESSION: Evidence of posterior fusion hardware with interbody fusion at the L5-S1 level. Electronically Signed   By: Elberta Fortisaniel  Boyle M.D.   On: 12/27/2017 18:16   Dg Lumbar Spine 1 View  Result Date: 12/27/2017 CLINICAL DATA:  Localization image right L5-S1 transforaminal lumbar interbody fusion with instrumentation and allograft. EXAM: LUMBAR SPINE - 1 VIEW COMPARISON:  11/13/2017 FINDINGS: Vertebral body alignment and heights are normal. There is minimal spondylosis present. There is mild  disc space narrowing at the L5-S1 level. Metallic instruments are present with tip between the L2 and L3 spinous processes. The more inferior metallic surgical instrument has tip just superficial and between the L4 and L5 spinous processes. IMPRESSION: Metallic instruments as described with tips between the L2 and L3 spinous processes and between the L4 and L5 spinous processes. 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 Electronically Signed   By: Elberta Fortis M.D.   On: 12/27/2017 18:19   Dg C-arm 1-60 Min  Result Date: 12/27/2017 CLINICAL DATA:  Intraoperative fluoroscopy. L5-S1 right transforaminal lumbar interbody fusion with instrumentation and allograft., EXAM: DG C-ARM 61-120 MIN; LUMBAR SPINE - 2-3 VIEW COMPARISON:  11/13/2017 FINDINGS: Exam demonstrates placement of posterior fusion hardware with bilateral pedicle screws at the L5-S1 level as hardware is intact. Interbody fusion at L5-S1. Remainder of the exam unchanged. IMPRESSION: Evidence of posterior fusion hardware with interbody fusion at the L5-S1 level. Electronically Signed   By: Elberta Fortis M.D.   On: 12/27/2017 18:16   Dg C-arm 1-60 Min  Result Date: 12/27/2017 CLINICAL DATA:  Intraoperative fluoroscopy. L5-S1 right transforaminal lumbar interbody fusion with instrumentation and allograft., EXAM: DG C-ARM 61-120 MIN; LUMBAR SPINE - 2-3 VIEW COMPARISON:  11/13/2017 FINDINGS: Exam demonstrates placement of posterior fusion hardware with bilateral pedicle screws at the L5-S1 level as hardware is intact. Interbody fusion at L5-S1. Remainder of the exam unchanged. IMPRESSION: Evidence of posterior fusion hardware with interbody fusion at the L5-S1 level. Electronically Signed   By: Elberta Fortis M.D.   On: 12/27/2017 18:16    Disposition:    POD #1 s/p L5/S1 fusion, doing well  - up with PT/OT, encourage ambulation - Percocet for pain, Valium for muscle spasms -Written scripts for pain signed and in chart -D/C instructions sheet printed and in chart -D/C today  -F/U in office 2 weeks   Signed: Georga Bora 01/02/2018, 11:44 AM

## 2018-09-18 IMAGING — CR DG CHEST 2V
2 series · 2 of 2 positions shown · non-contrast
Comparison: None.

CLINICAL DATA: 37 y/o  F; preop exam for lower back surgery.

EXAM:
CHEST - 2 VIEW

[w chest pa]
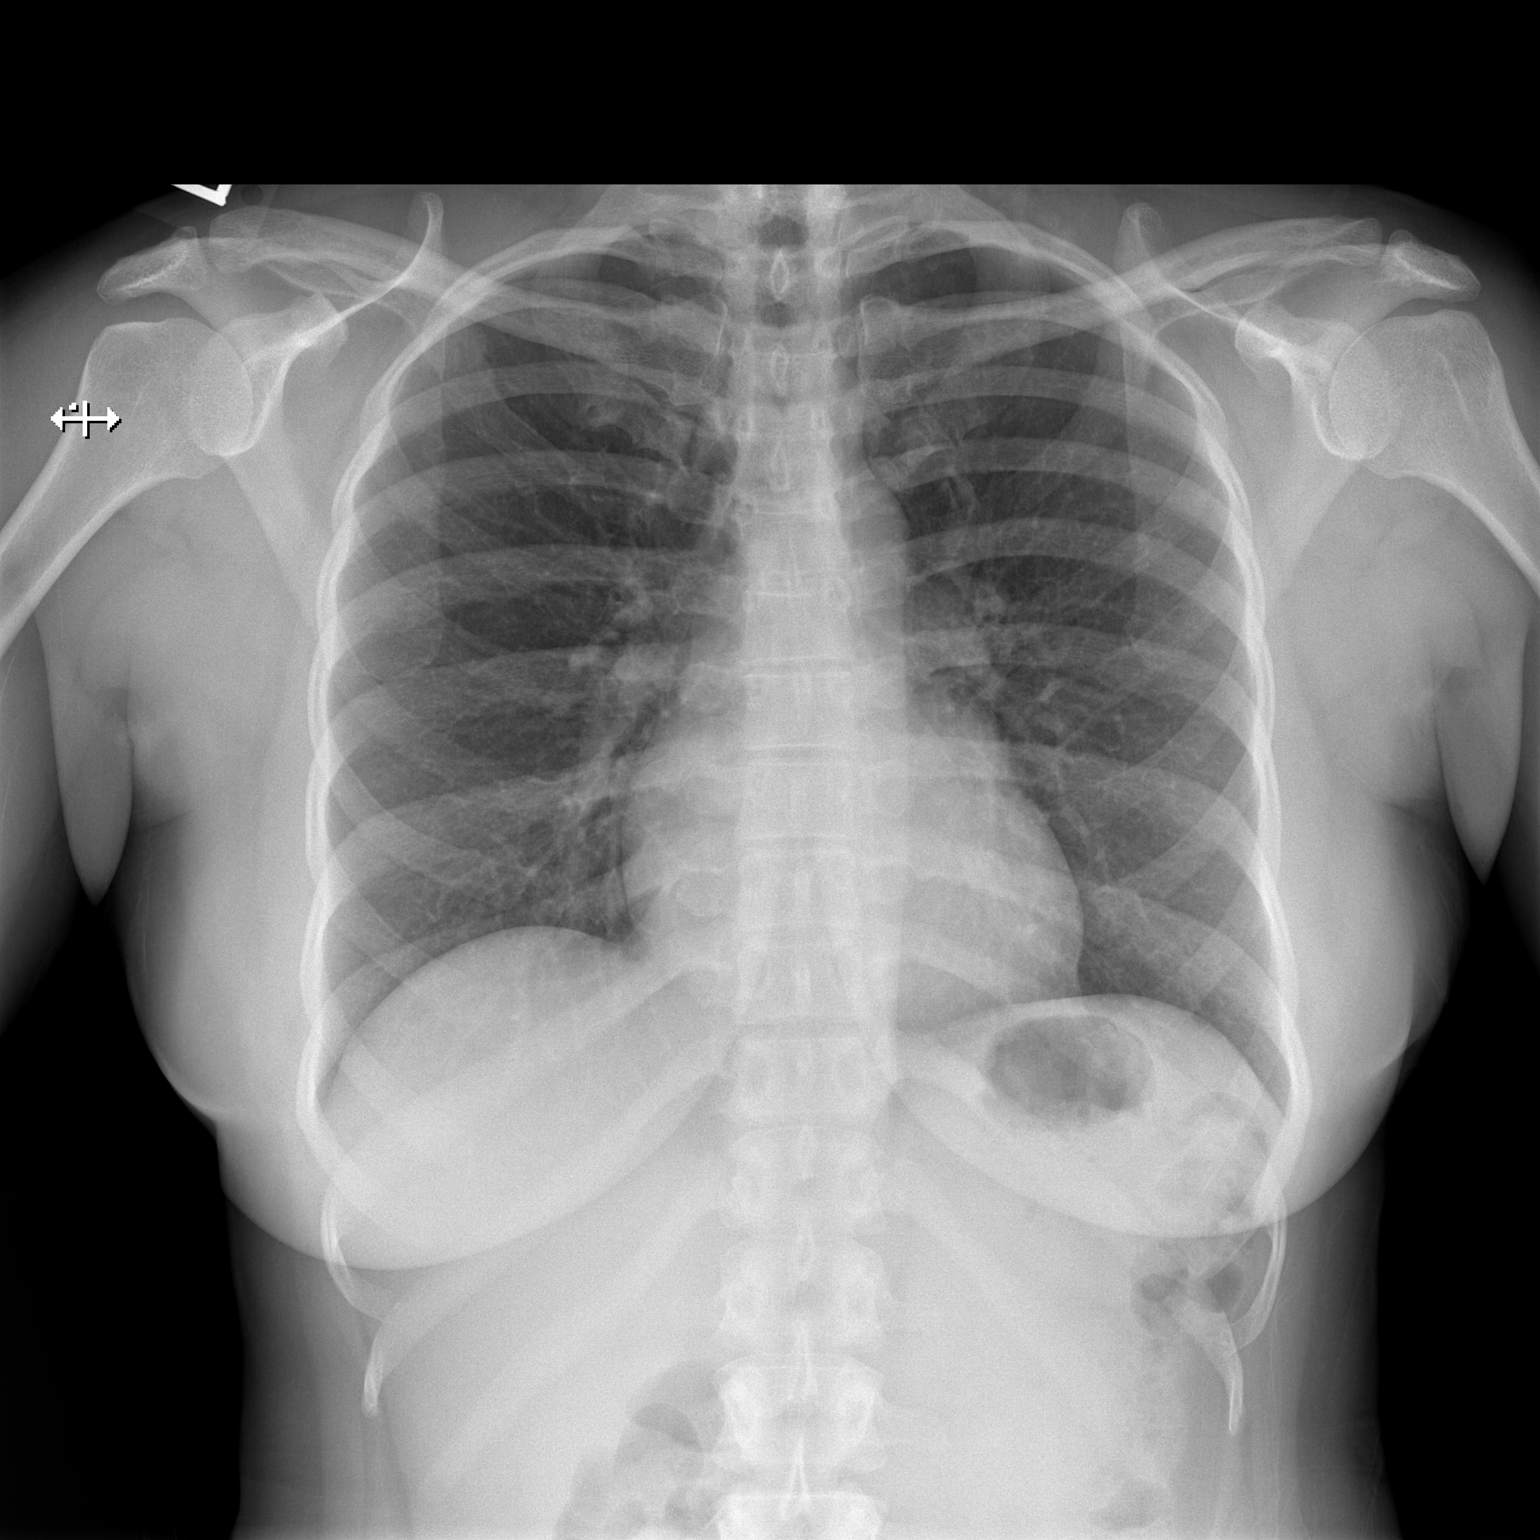

[w chest lat]
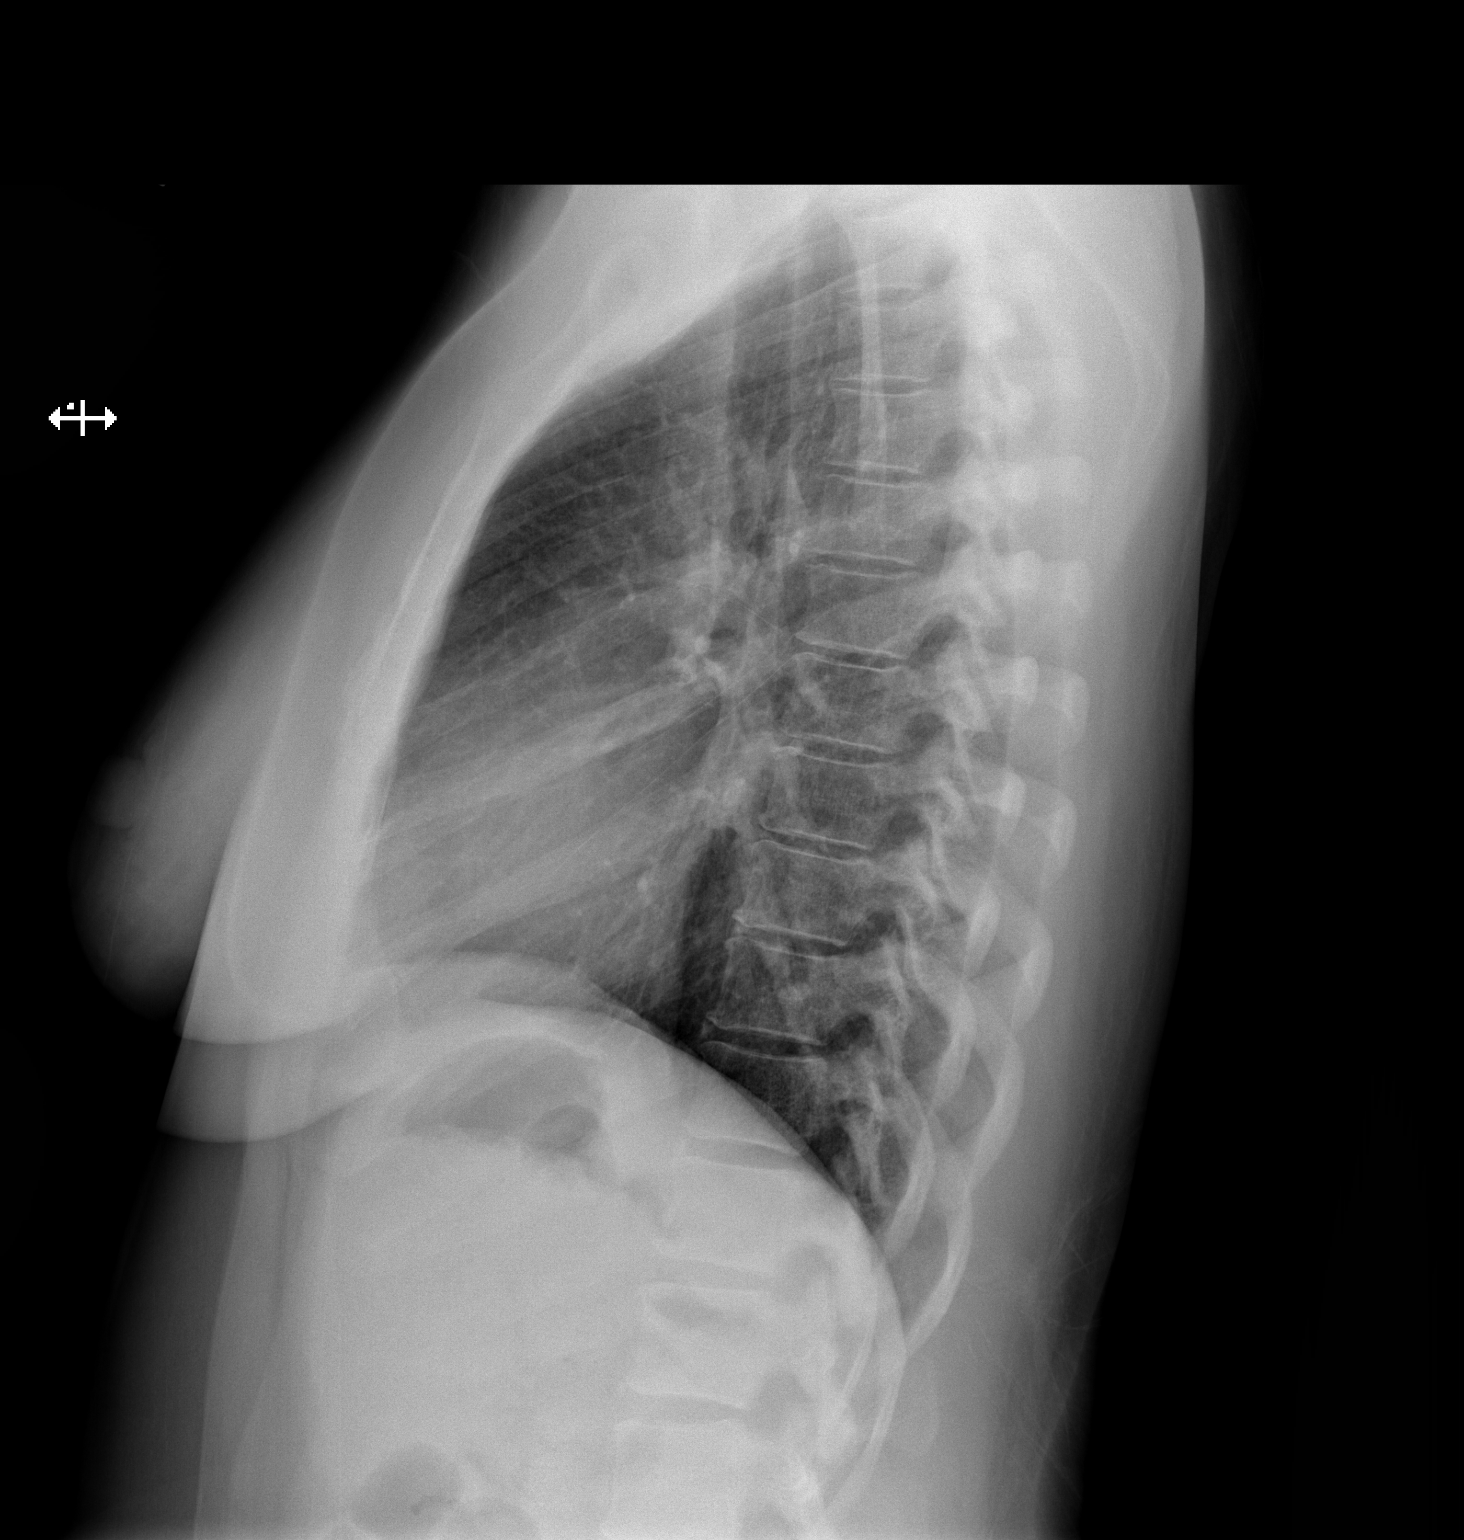

[2 of 2 positions shown; findings below may reference images not displayed]

FINDINGS: The heart size and mediastinal contours are within normal limits.
Both lungs are clear. The visualized skeletal structures are
unremarkable.
IMPRESSION: No active cardiopulmonary disease.

By: Gelo Ru M.D.

## 2019-01-25 ENCOUNTER — Other Ambulatory Visit: Payer: Self-pay

## 2019-01-25 DIAGNOSIS — Z20822 Contact with and (suspected) exposure to covid-19: Secondary | ICD-10-CM

## 2019-01-26 LAB — NOVEL CORONAVIRUS, NAA: SARS-CoV-2, NAA: NOT DETECTED

## 2019-01-28 ENCOUNTER — Telehealth: Payer: Self-pay | Admitting: General Practice

## 2019-01-28 NOTE — Telephone Encounter (Signed)
Pt is returning call for covid results.   Advised of Not Detected result.
